# Patient Record
Sex: Male | Born: 1985 | Race: Black or African American | Hispanic: No | Marital: Married | Smoking: Current every day smoker
Health system: Southern US, Community
[De-identification: ages and names within clinical notes are randomized; demographics above are authoritative.]

## PROBLEM LIST (undated history)

## (undated) DIAGNOSIS — S31109A Unspecified open wound of abdominal wall, unspecified quadrant without penetration into peritoneal cavity, initial encounter: Secondary | ICD-10-CM

---
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
## 2008-10-12 ENCOUNTER — Emergency Department (HOSPITAL_COMMUNITY): Admission: AC | Admit: 2008-10-12 | Discharge: 2008-10-13 | Payer: Self-pay

## 2009-05-15 ENCOUNTER — Emergency Department (HOSPITAL_COMMUNITY): Admission: EM | Admit: 2009-05-15 | Discharge: 2009-05-15 | Payer: Self-pay | Admitting: Emergency Medicine

## 2010-04-15 LAB — TYPE AND SCREEN: ABO/RH(D): O POS

## 2010-04-15 LAB — CBC
Hemoglobin: 15.5 g/dL (ref 13.0–17.0)
MCV: 91.1 fL (ref 78.0–100.0)
RBC: 5.03 MIL/uL (ref 4.22–5.81)
RDW: 12 % (ref 11.5–15.5)
WBC: 7.5 10*3/uL (ref 4.0–10.5)

## 2010-04-15 LAB — POCT I-STAT, CHEM 8
BUN: 6 mg/dL (ref 6–23)
Creatinine, Ser: 1.3 mg/dL (ref 0.4–1.5)
HCT: 50 % (ref 39.0–52.0)
Hemoglobin: 17 g/dL (ref 13.0–17.0)
Potassium: 3.5 meq/L (ref 3.5–5.1)
Sodium: 142 meq/L (ref 135–145)
TCO2: 26 mmol/L (ref 0–100)
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
## 2014-08-05 ENCOUNTER — Encounter (HOSPITAL_COMMUNITY): Payer: Self-pay | Admitting: *Deleted

## 2014-08-05 ENCOUNTER — Emergency Department (HOSPITAL_COMMUNITY)
Admission: EM | Admit: 2014-08-05 | Discharge: 2014-08-05 | Disposition: A | Discharge status: Home / Self Care | Payer: Self-pay | Attending: Emergency Medicine | Admitting: Emergency Medicine

## 2014-08-05 DIAGNOSIS — G8929 Other chronic pain: Secondary | ICD-10-CM | POA: Insufficient documentation

## 2014-08-05 DIAGNOSIS — M545 Low back pain: Secondary | ICD-10-CM | POA: Insufficient documentation

## 2014-08-05 DIAGNOSIS — Z87828 Personal history of other (healed) physical injury and trauma: Secondary | ICD-10-CM | POA: Insufficient documentation

## 2014-08-05 DIAGNOSIS — M25532 Pain in left wrist: Secondary | ICD-10-CM | POA: Insufficient documentation

## 2014-08-05 DIAGNOSIS — Z720 Tobacco use: Secondary | ICD-10-CM | POA: Insufficient documentation

## 2014-08-05 HISTORY — DX: Unspecified open wound of abdominal wall, unspecified quadrant without penetration into peritoneal cavity, initial encounter: S31.109A

## 2014-08-05 MED ORDER — KETOROLAC TROMETHAMINE 60 MG/2ML IM SOLN
60.0000 mg | Freq: Once | INTRAMUSCULAR | Status: AC
  Administered 2014-08-05: 60 mg via INTRAMUSCULAR
  Filled 2014-08-05: qty 2

## 2014-08-05 MED ORDER — PREDNISONE 10 MG (21) PO TBPK: ORAL_TABLET | ORAL | Status: AC

## 2014-08-05 MED ORDER — METHOCARBAMOL 500 MG PO TABS: 500.0000 mg | ORAL_TABLET | Freq: Two times a day (BID) | ORAL | Status: AC
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
## 2014-08-05 NOTE — ED Notes (Signed)
Pt c/o lower back pain and right rib pain and left wrist pain for 3-4 days.  St's he has been sleeping on floor.  Pt st's he has taken a muscle relaxer without relief

## 2014-08-05 NOTE — Discharge Instructions (Signed)
Back Pain, Adult Low back pain is very common. About 1 in 5 people have back pain.The cause of low back pain is rarely dangerous. The pain often gets better over time.About half of people with a sudden onset of back pain feel better in just 2 weeks. About 8 in 10 people feel better by 6 weeks.  CAUSES Some common causes of back pain include:  Strain of the muscles or ligaments supporting the spine.  Wear and tear (degeneration) of the spinal discs.  Arthritis.  Direct injury to the back. DIAGNOSIS Most of the time, the direct cause of low back pain is not known.However, back pain can be treated effectively even when the exact cause of the pain is unknown.Answering your caregiver's questions about your overall health and symptoms is one of the most accurate ways to make sure the cause of your pain is not dangerous. If your caregiver needs more information, he or she may order lab work or imaging tests (X-rays or MRIs).However, even if imaging tests show changes in your back, this usually does not require surgery. HOME CARE INSTRUCTIONS For many people, back pain returns.Since low back pain is rarely dangerous, it is often a condition that people can learn to manageon their own.   Remain active. It is stressful on the back to sit or stand in one place. Do not sit, drive, or stand in one place for more than 30 minutes at a time. Take short walks on level surfaces as soon as pain allows.Try to increase the length of time you walk each day.  Do not stay in bed.Resting more than 1 or 2 days can delay your recovery.  Do not avoid exercise or work.Your body is made to move.It is not dangerous to be active, even though your back may hurt.Your back will likely heal faster if you return to being active before your pain is gone.  Pay attention to your body when you bend and lift. Many people have less discomfortwhen lifting if they bend their knees, keep the load close to their bodies,and  avoid twisting. Often, the most comfortable positions are those that put less stress on your recovering back.  Find a comfortable position to sleep. Use a firm mattress and lie on your side with your knees slightly bent. If you lie on your back, put a pillow under your knees.  Only take over-the-counter or prescription medicines as directed by your caregiver. Over-the-counter medicines to reduce pain and inflammation are often the most helpful.Your caregiver may prescribe muscle relaxant drugs.These medicines help dull your pain so you can more quickly return to your normal activities and healthy exercise.  Put ice on the injured area.  Put ice in a plastic bag.  Place a towel between your skin and the bag.  Leave the ice on for 15-20 minutes, 03-04 times a day for the first 2 to 3 days. After that, ice and heat may be alternated to reduce pain and spasms.  Ask your caregiver about trying back exercises and gentle massage. This may be of some benefit.  Avoid feeling anxious or stressed.Stress increases muscle tension and can worsen back pain.It is important to recognize when you are anxious or stressed and learn ways to manage it.Exercise is a great option. SEEK MEDICAL CARE IF:  You have pain that is not relieved with rest or medicine.  You have pain that does not improve in 1 week.  You have new symptoms.  You are generally not feeling well. SEEK   IMMEDIATE MEDICAL CARE IF:   You have pain that radiates from your back into your legs.  You develop new bowel or bladder control problems.  You have unusual weakness or numbness in your arms or legs.  You develop nausea or vomiting.  You develop abdominal pain.  You feel faint. Document Released: 12/27/2004 Document Revised: 06/28/2011 Document Reviewed: 04/30/2013 ExitCare Patient Information 2015 [employer]. This information is not intended to replace advice given to you by your health care provider. Make sure you  discuss any questions you have with your health care provider.  
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
## 2017-05-14 ENCOUNTER — Encounter (HOSPITAL_COMMUNITY): Payer: Self-pay

## 2017-05-14 ENCOUNTER — Emergency Department (HOSPITAL_COMMUNITY)
Admission: EM | Admit: 2017-05-14 | Discharge: 2017-05-14 | Disposition: A | Discharge status: Home / Self Care | Payer: Self-pay | Attending: Emergency Medicine | Admitting: Emergency Medicine

## 2017-05-14 ENCOUNTER — Emergency Department (HOSPITAL_COMMUNITY): Payer: Self-pay

## 2017-05-14 ENCOUNTER — Other Ambulatory Visit: Payer: Self-pay

## 2017-05-14 DIAGNOSIS — Y939 Activity, unspecified: Secondary | ICD-10-CM | POA: Insufficient documentation

## 2017-05-14 DIAGNOSIS — W3400XA Accidental discharge from unspecified firearms or gun, initial encounter: Secondary | ICD-10-CM

## 2017-05-14 DIAGNOSIS — Y929 Unspecified place or not applicable: Secondary | ICD-10-CM | POA: Insufficient documentation

## 2017-05-14 DIAGNOSIS — F17210 Nicotine dependence, cigarettes, uncomplicated: Secondary | ICD-10-CM | POA: Insufficient documentation

## 2017-05-14 DIAGNOSIS — Z23 Encounter for immunization: Secondary | ICD-10-CM | POA: Insufficient documentation

## 2017-05-14 DIAGNOSIS — S91331A Puncture wound without foreign body, right foot, initial encounter: Secondary | ICD-10-CM

## 2017-05-14 DIAGNOSIS — Y998 Other external cause status: Secondary | ICD-10-CM | POA: Insufficient documentation

## 2017-05-14 DIAGNOSIS — S91301A Unspecified open wound, right foot, initial encounter: Secondary | ICD-10-CM | POA: Insufficient documentation

## 2017-05-14 MED ORDER — TRAMADOL HCL 50 MG PO TABS: 50.0000 mg | ORAL_TABLET | Freq: Four times a day (QID) | ORAL | 0 refills | Status: AC | PRN

## 2017-05-14 MED ORDER — HYDROCODONE-ACETAMINOPHEN 5-325 MG PO TABS
1.0000 | ORAL_TABLET | Freq: Once | ORAL | Status: AC
  Administered 2017-05-14: 1 via ORAL
  Filled 2017-05-14: qty 1

## 2017-05-14 MED ORDER — CEPHALEXIN 500 MG PO CAPS: 1000.0000 mg | ORAL_CAPSULE | Freq: Two times a day (BID) | ORAL | 0 refills | Status: AC

## 2017-05-14 MED ORDER — IBUPROFEN 800 MG PO TABS: 800.0000 mg | ORAL_TABLET | Freq: Three times a day (TID) | ORAL | 0 refills | Status: DC | PRN

## 2017-05-14 MED ORDER — BUPIVACAINE HCL (PF) 0.25 % IJ SOLN
20.0000 mL | Freq: Once | INTRAMUSCULAR | Status: AC
  Administered 2017-05-14: 20 mL
  Filled 2017-05-14: qty 30

## 2017-05-14 MED ORDER — TETANUS-DIPHTH-ACELL PERTUSSIS 5-2.5-18.5 LF-MCG/0.5 IM SUSP
0.5000 mL | Freq: Once | INTRAMUSCULAR | Status: AC
  Administered 2017-05-14: 0.5 mL via INTRAMUSCULAR
  Filled 2017-05-14: qty 0.5

## 2017-05-14 NOTE — ED Notes (Signed)
ED Provider at bedside. 
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
## 2017-05-14 NOTE — Discharge Instructions (Addendum)
Return here as needed.  Follow-up with the doctor provided for any worsening in your condition.  Soak the foot in warm water and Epsom salts.  Keep the areas clean and dry and covered.

## 2022-11-13 ENCOUNTER — Other Ambulatory Visit: Payer: Self-pay

## 2022-11-13 ENCOUNTER — Emergency Department (HOSPITAL_COMMUNITY): Payer: Self-pay

## 2022-11-13 ENCOUNTER — Encounter (HOSPITAL_COMMUNITY): Payer: Self-pay

## 2022-11-13 ENCOUNTER — Emergency Department (HOSPITAL_COMMUNITY)
Admission: EM | Admit: 2022-11-13 | Discharge: 2022-11-13 | Disposition: A | Discharge status: Home / Self Care | Payer: Self-pay | Attending: Emergency Medicine | Admitting: Emergency Medicine

## 2022-11-13 DIAGNOSIS — S40012A Contusion of left shoulder, initial encounter: Secondary | ICD-10-CM | POA: Insufficient documentation

## 2022-11-13 DIAGNOSIS — Y92410 Unspecified street and highway as the place of occurrence of the external cause: Secondary | ICD-10-CM | POA: Diagnosis not present

## 2022-11-13 DIAGNOSIS — T148XXA Other injury of unspecified body region, initial encounter: Secondary | ICD-10-CM

## 2022-11-13 MED ORDER — CYCLOBENZAPRINE HCL 5 MG PO TABS: 5.0000 mg | ORAL_TABLET | Freq: Three times a day (TID) | ORAL | 0 refills | Status: AC | PRN

## 2022-11-13 MED ORDER — IBUPROFEN 800 MG PO TABS
800.0000 mg | ORAL_TABLET | Freq: Once | ORAL | Status: AC
  Administered 2022-11-13: 800 mg via ORAL
  Filled 2022-11-13: qty 1

## 2022-11-13 MED ORDER — IBUPROFEN 800 MG PO TABS: 800.0000 mg | ORAL_TABLET | Freq: Three times a day (TID) | ORAL | 0 refills | Status: AC

## 2022-11-13 MED ORDER — OXYCODONE-ACETAMINOPHEN 5-325 MG PO TABS
2.0000 | ORAL_TABLET | Freq: Once | ORAL | Status: AC
  Administered 2022-11-13: 2 via ORAL
  Filled 2022-11-13: qty 2
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
## 2022-11-13 NOTE — Discharge Instructions (Signed)
As we discussed you do not have any fractures on your x-ray today  Take Motrin for pain and Flexeril for muscle spasm.  Please apply ice to help with the swelling  I am expecting you to be stiff and sore for 3 to 4 days  See your doctor for follow-up  Return to ER if you have severe shoulder pain or back pain or vomiting
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
